# Patient Record
Sex: Female | Born: 1965 | Race: White | Hispanic: No | Smoking: Current every day smoker
Health system: Southern US, Community
[De-identification: ages and names within clinical notes are randomized; demographics above are authoritative.]

## PROBLEM LIST (undated history)

## (undated) DIAGNOSIS — G43909 Migraine, unspecified, not intractable, without status migrainosus: Secondary | ICD-10-CM

## (undated) DIAGNOSIS — G4730 Sleep apnea, unspecified: Secondary | ICD-10-CM

## (undated) DIAGNOSIS — I10 Essential (primary) hypertension: Secondary | ICD-10-CM

## (undated) DIAGNOSIS — S0300XA Dislocation of jaw, unspecified side, initial encounter: Secondary | ICD-10-CM

## (undated) DIAGNOSIS — K219 Gastro-esophageal reflux disease without esophagitis: Secondary | ICD-10-CM

---

## 2013-03-02 ENCOUNTER — Encounter (HOSPITAL_BASED_OUTPATIENT_CLINIC_OR_DEPARTMENT_OTHER): Payer: Self-pay | Admitting: Emergency Medicine

## 2013-03-02 ENCOUNTER — Emergency Department (HOSPITAL_BASED_OUTPATIENT_CLINIC_OR_DEPARTMENT_OTHER): Payer: BC Managed Care – PPO

## 2013-03-02 ENCOUNTER — Emergency Department (HOSPITAL_BASED_OUTPATIENT_CLINIC_OR_DEPARTMENT_OTHER)
Admission: EM | Admit: 2013-03-02 | Discharge: 2013-03-02 | Disposition: A | Discharge status: Home / Self Care | Payer: BC Managed Care – PPO | Attending: Emergency Medicine | Admitting: Emergency Medicine

## 2013-03-02 DIAGNOSIS — Y92410 Unspecified street and highway as the place of occurrence of the external cause: Secondary | ICD-10-CM | POA: Insufficient documentation

## 2013-03-02 DIAGNOSIS — K219 Gastro-esophageal reflux disease without esophagitis: Secondary | ICD-10-CM | POA: Insufficient documentation

## 2013-03-02 DIAGNOSIS — F17200 Nicotine dependence, unspecified, uncomplicated: Secondary | ICD-10-CM | POA: Insufficient documentation

## 2013-03-02 DIAGNOSIS — Z91040 Latex allergy status: Secondary | ICD-10-CM | POA: Insufficient documentation

## 2013-03-02 DIAGNOSIS — Y9389 Activity, other specified: Secondary | ICD-10-CM | POA: Insufficient documentation

## 2013-03-02 DIAGNOSIS — Z79899 Other long term (current) drug therapy: Secondary | ICD-10-CM | POA: Insufficient documentation

## 2013-03-02 DIAGNOSIS — G43909 Migraine, unspecified, not intractable, without status migrainosus: Secondary | ICD-10-CM | POA: Insufficient documentation

## 2013-03-02 DIAGNOSIS — IMO0002 Reserved for concepts with insufficient information to code with codable children: Secondary | ICD-10-CM | POA: Insufficient documentation

## 2013-03-02 DIAGNOSIS — S0993XA Unspecified injury of face, initial encounter: Secondary | ICD-10-CM | POA: Insufficient documentation

## 2013-03-02 DIAGNOSIS — I10 Essential (primary) hypertension: Secondary | ICD-10-CM | POA: Insufficient documentation

## 2013-03-02 DIAGNOSIS — Z8669 Personal history of other diseases of the nervous system and sense organs: Secondary | ICD-10-CM | POA: Insufficient documentation

## 2013-03-02 HISTORY — DX: Essential (primary) hypertension: I10

## 2013-03-02 HISTORY — DX: Migraine, unspecified, not intractable, without status migrainosus: G43.909

## 2013-03-02 HISTORY — DX: Dislocation of jaw, unspecified side, initial encounter: S03.00XA

## 2013-03-02 HISTORY — DX: Sleep apnea, unspecified: G47.30

## 2013-03-02 HISTORY — DX: Gastro-esophageal reflux disease without esophagitis: K21.9

## 2013-03-02 MED ORDER — OXYCODONE-ACETAMINOPHEN 5-325 MG PO TABS: 1.0000 | ORAL_TABLET | Freq: Four times a day (QID) | ORAL | Status: AC | PRN

## 2013-03-02 MED ORDER — METHOCARBAMOL 500 MG PO TABS: 500.0000 mg | ORAL_TABLET | Freq: Two times a day (BID) | ORAL | Status: AC

## 2013-03-02 NOTE — ED Provider Notes (Signed)
Medical screening examination/treatment/procedure(s) were performed by non-physician practitioner and as supervising physician I was immediately available for consultation/collaboration.  EKG Interpretation   None         Charles B. Bernette Mayers, MD 03/02/13 2311
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
## 2013-03-12 ENCOUNTER — Emergency Department (HOSPITAL_BASED_OUTPATIENT_CLINIC_OR_DEPARTMENT_OTHER): Payer: BC Managed Care – PPO

## 2013-03-12 ENCOUNTER — Emergency Department (HOSPITAL_BASED_OUTPATIENT_CLINIC_OR_DEPARTMENT_OTHER)
Admission: EM | Admit: 2013-03-12 | Discharge: 2013-03-12 | Disposition: A | Discharge status: Home / Self Care | Payer: BC Managed Care – PPO | Attending: Emergency Medicine | Admitting: Emergency Medicine

## 2013-03-12 ENCOUNTER — Encounter (HOSPITAL_BASED_OUTPATIENT_CLINIC_OR_DEPARTMENT_OTHER): Payer: Self-pay | Admitting: Emergency Medicine

## 2013-03-12 DIAGNOSIS — K219 Gastro-esophageal reflux disease without esophagitis: Secondary | ICD-10-CM | POA: Insufficient documentation

## 2013-03-12 DIAGNOSIS — J209 Acute bronchitis, unspecified: Secondary | ICD-10-CM | POA: Insufficient documentation

## 2013-03-12 DIAGNOSIS — Z87828 Personal history of other (healed) physical injury and trauma: Secondary | ICD-10-CM | POA: Insufficient documentation

## 2013-03-12 DIAGNOSIS — M545 Low back pain, unspecified: Secondary | ICD-10-CM | POA: Insufficient documentation

## 2013-03-12 DIAGNOSIS — Z91040 Latex allergy status: Secondary | ICD-10-CM | POA: Insufficient documentation

## 2013-03-12 DIAGNOSIS — Z79899 Other long term (current) drug therapy: Secondary | ICD-10-CM | POA: Insufficient documentation

## 2013-03-12 DIAGNOSIS — I10 Essential (primary) hypertension: Secondary | ICD-10-CM | POA: Insufficient documentation

## 2013-03-12 DIAGNOSIS — M542 Cervicalgia: Secondary | ICD-10-CM | POA: Insufficient documentation

## 2013-03-12 DIAGNOSIS — G43909 Migraine, unspecified, not intractable, without status migrainosus: Secondary | ICD-10-CM | POA: Insufficient documentation

## 2013-03-12 DIAGNOSIS — F17200 Nicotine dependence, unspecified, uncomplicated: Secondary | ICD-10-CM | POA: Insufficient documentation

## 2013-03-12 DIAGNOSIS — G8911 Acute pain due to trauma: Secondary | ICD-10-CM | POA: Insufficient documentation

## 2013-03-12 NOTE — ED Notes (Signed)
Patient here with increased congestion and cough for the past few days. Was being treated for bronchitis and reports that she feels worse.
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
## 2014-12-12 IMAGING — CR DG CHEST 2V
2 series · 2 of 2 positions shown · non-contrast
Comparison: 02/17/2013

CLINICAL DATA: Cough

EXAM:
CHEST  2 VIEW

[w chest pa]
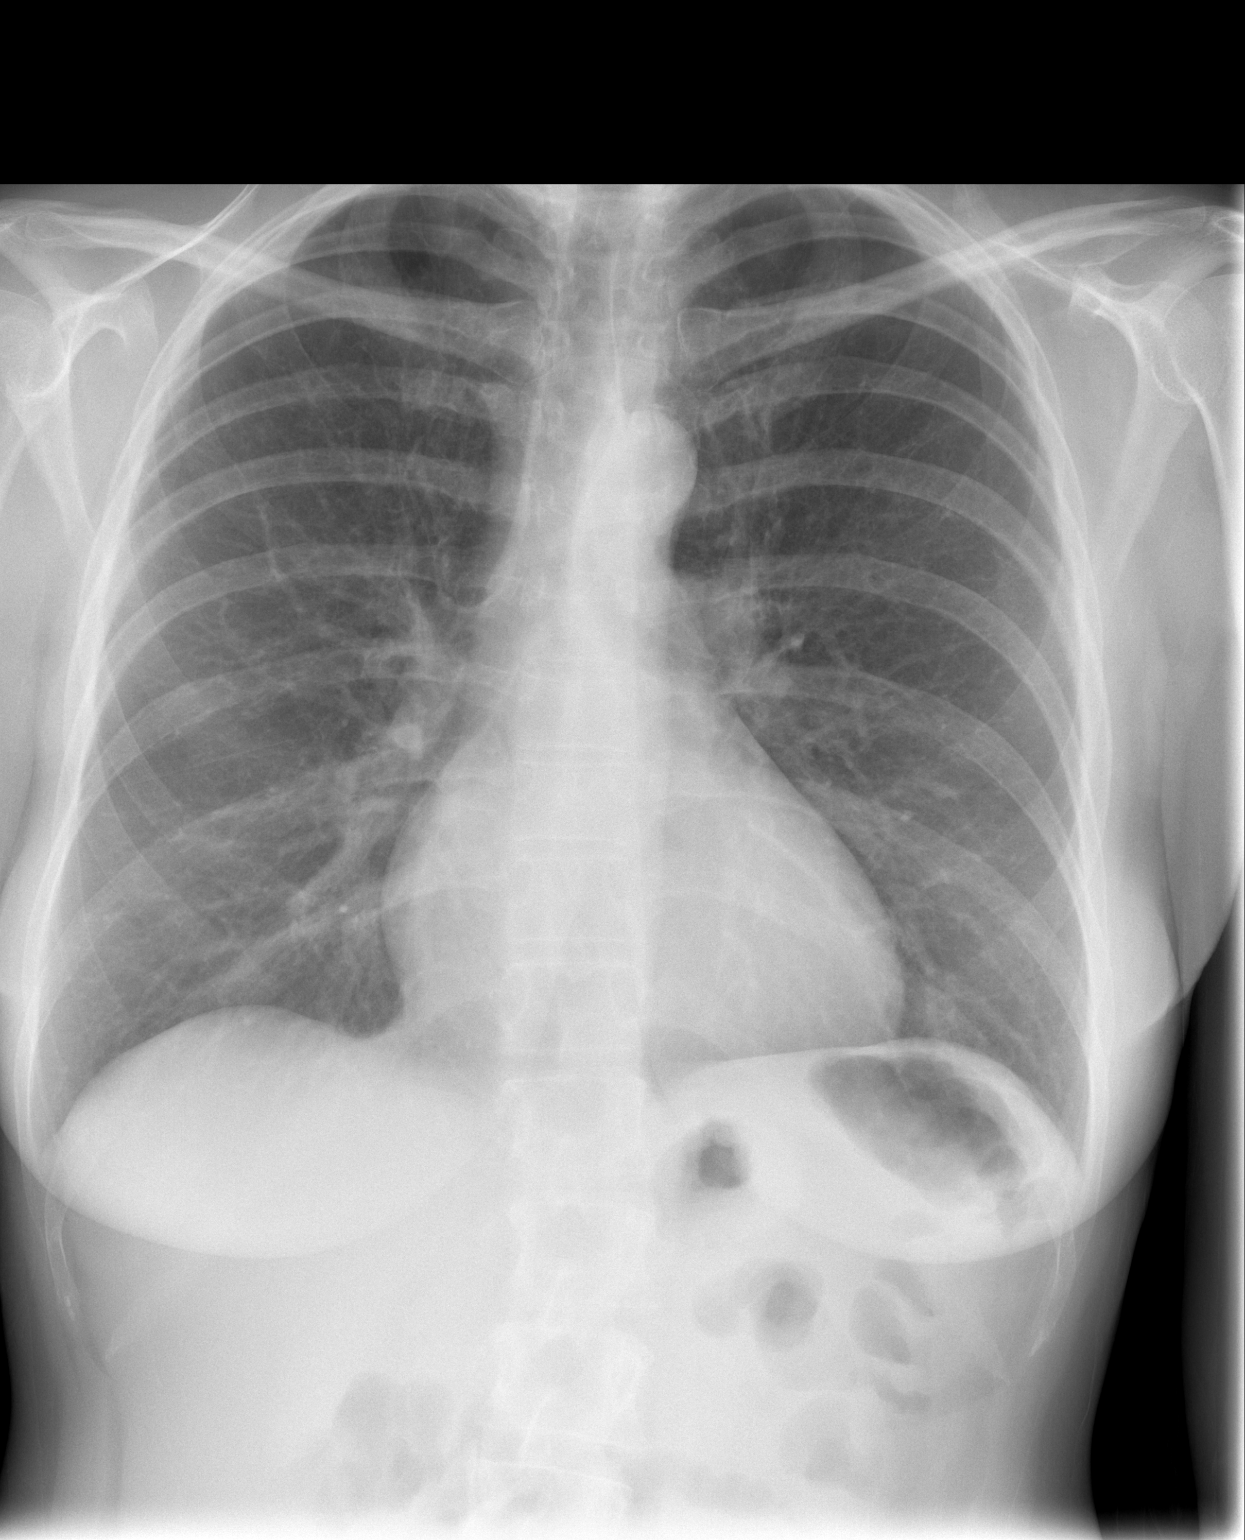

[w chest lat]
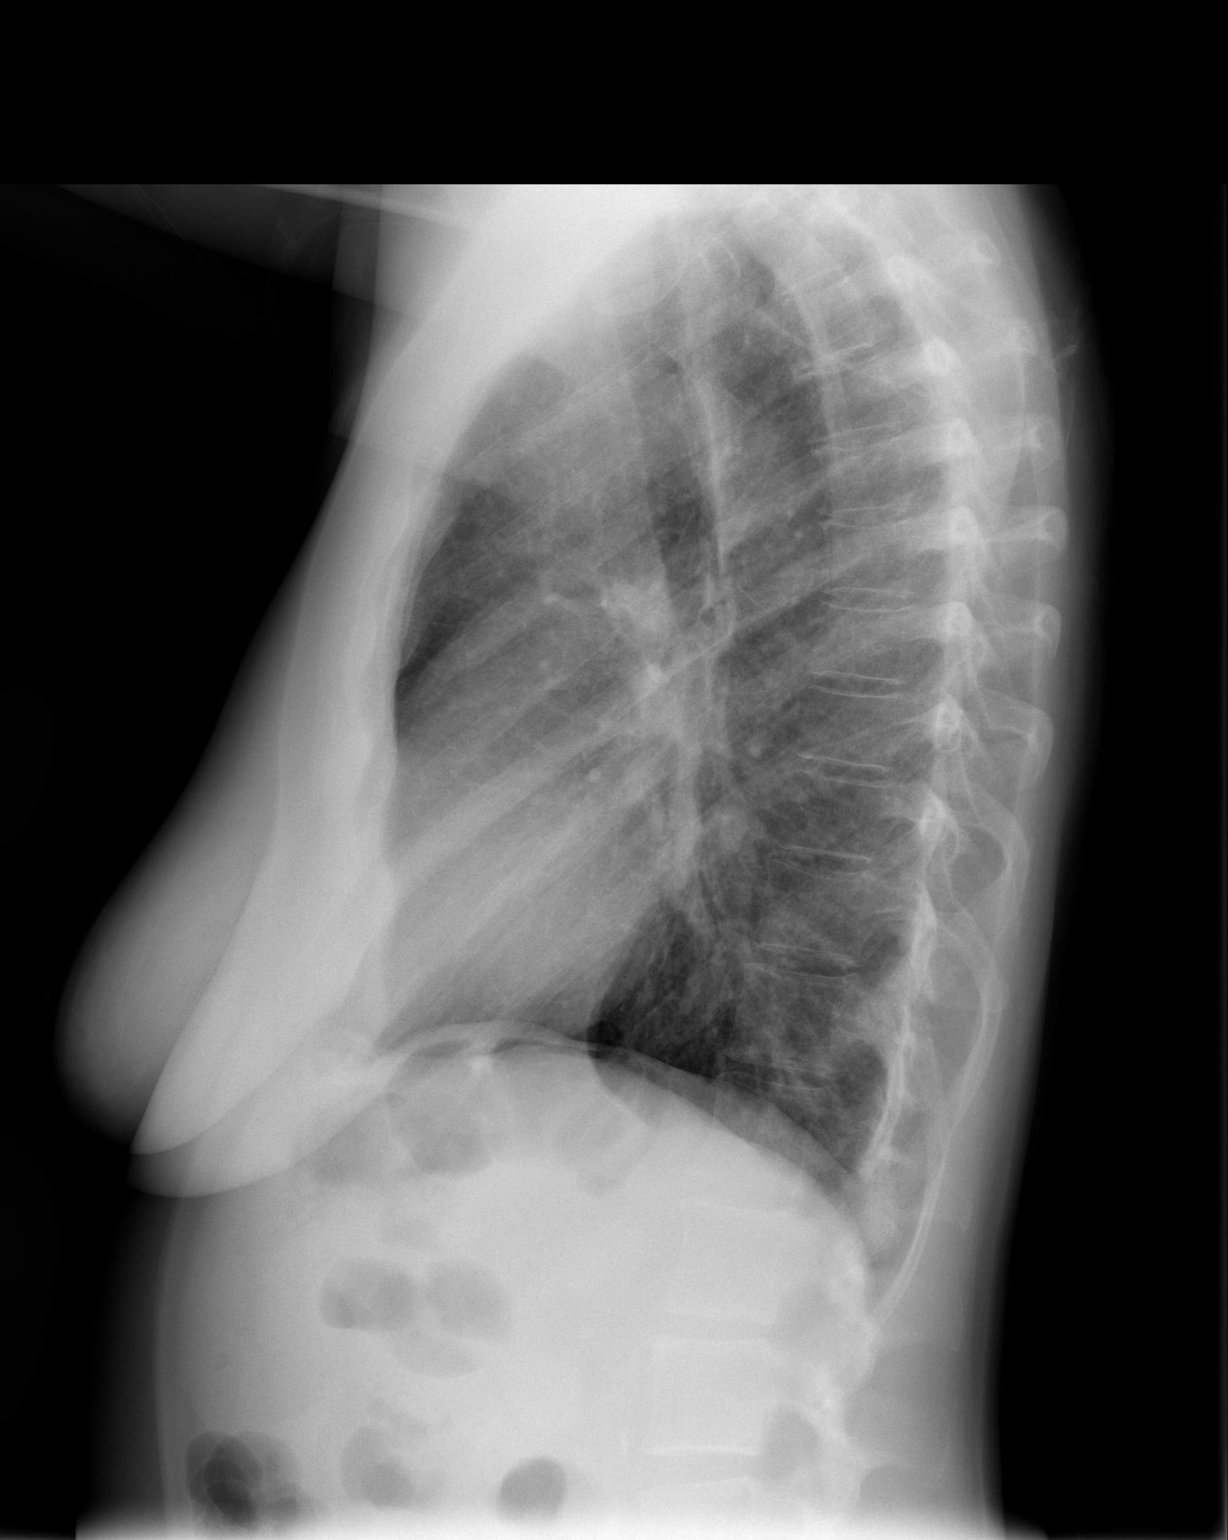

[2 of 2 positions shown; findings below may reference images not displayed]

FINDINGS: The heart size and mediastinal contours are within normal limits.
Both lungs are clear. The visualized skeletal structures are
unremarkable.
IMPRESSION: No active cardiopulmonary disease.

## 2015-02-05 ENCOUNTER — Emergency Department (HOSPITAL_BASED_OUTPATIENT_CLINIC_OR_DEPARTMENT_OTHER)
Admission: EM | Admit: 2015-02-05 | Discharge: 2015-02-05 | Disposition: A | Discharge status: Home / Self Care | Payer: BLUE CROSS/BLUE SHIELD | Attending: Emergency Medicine | Admitting: Emergency Medicine

## 2015-02-05 ENCOUNTER — Encounter (HOSPITAL_BASED_OUTPATIENT_CLINIC_OR_DEPARTMENT_OTHER): Payer: Self-pay | Admitting: *Deleted

## 2015-02-05 ENCOUNTER — Emergency Department (HOSPITAL_BASED_OUTPATIENT_CLINIC_OR_DEPARTMENT_OTHER): Payer: BLUE CROSS/BLUE SHIELD

## 2015-02-05 DIAGNOSIS — Z79899 Other long term (current) drug therapy: Secondary | ICD-10-CM | POA: Insufficient documentation

## 2015-02-05 DIAGNOSIS — K219 Gastro-esophageal reflux disease without esophagitis: Secondary | ICD-10-CM | POA: Insufficient documentation

## 2015-02-05 DIAGNOSIS — G43909 Migraine, unspecified, not intractable, without status migrainosus: Secondary | ICD-10-CM | POA: Diagnosis not present

## 2015-02-05 DIAGNOSIS — I10 Essential (primary) hypertension: Secondary | ICD-10-CM | POA: Diagnosis not present

## 2015-02-05 DIAGNOSIS — Z91040 Latex allergy status: Secondary | ICD-10-CM | POA: Insufficient documentation

## 2015-02-05 DIAGNOSIS — J209 Acute bronchitis, unspecified: Secondary | ICD-10-CM | POA: Diagnosis not present

## 2015-02-05 DIAGNOSIS — R05 Cough: Secondary | ICD-10-CM

## 2015-02-05 DIAGNOSIS — F17210 Nicotine dependence, cigarettes, uncomplicated: Secondary | ICD-10-CM | POA: Insufficient documentation

## 2015-02-05 DIAGNOSIS — R0781 Pleurodynia: Secondary | ICD-10-CM

## 2015-02-05 DIAGNOSIS — R079 Chest pain, unspecified: Secondary | ICD-10-CM | POA: Diagnosis present

## 2015-02-05 DIAGNOSIS — R059 Cough, unspecified: Secondary | ICD-10-CM

## 2015-02-05 LAB — URINALYSIS, ROUTINE W REFLEX MICROSCOPIC
Bilirubin Urine: NEGATIVE
Glucose, UA: NEGATIVE mg/dL
Hgb urine dipstick: NEGATIVE
Ketones, ur: NEGATIVE mg/dL
Leukocytes, UA: NEGATIVE
Nitrite: NEGATIVE
Protein, ur: NEGATIVE mg/dL
Specific Gravity, Urine: 1.01 (ref 1.005–1.030)
pH: 7 (ref 5.0–8.0)

## 2015-02-05 LAB — BASIC METABOLIC PANEL
Anion gap: 8 (ref 5–15)
BUN: 16 mg/dL (ref 6–20)
CO2: 24 mmol/L (ref 22–32)
Calcium: 9.1 mg/dL (ref 8.9–10.3)
Chloride: 109 mmol/L (ref 101–111)
Creatinine, Ser: 0.84 mg/dL (ref 0.44–1.00)
GFR calc Af Amer: >60 mL/min (ref >60)
GFR calc non Af Amer: >60 mL/min (ref >60)
Glucose, Bld: 97 mg/dL (ref 65–99)
Potassium: 3.7 mmol/L (ref 3.5–5.1)
Sodium: 141 mmol/L (ref 135–145)

## 2015-02-05 LAB — CBC
HCT: 43.8 % (ref 36.0–46.0)
Hemoglobin: 14.4 g/dL (ref 12.0–15.0)
MCH: 30.3 pg (ref 26.0–34.0)
MCHC: 32.9 g/dL (ref 30.0–36.0)
MCV: 92 fL (ref 78.0–100.0)
Platelets: 250 10*3/uL (ref 150–400)
RBC: 4.76 MIL/uL (ref 3.87–5.11)
RDW: 13.9 % (ref 11.5–15.5)
WBC: 12.6 10*3/uL — ABNORMAL HIGH (ref 4.0–10.5)

## 2015-02-05 LAB — TROPONIN I

## 2015-02-05 MED ORDER — BENZONATATE 100 MG PO CAPS: 100.0000 mg | ORAL_CAPSULE | Freq: Three times a day (TID) | ORAL | Status: AC

## 2015-02-05 MED ORDER — IBUPROFEN 800 MG PO TABS
800.0000 mg | ORAL_TABLET | Freq: Three times a day (TID) | ORAL | Status: AC
Start: 2015-02-05 — End: ?

## 2015-02-05 MED ORDER — PREDNISONE 10 MG PO TABS: 40.0000 mg | ORAL_TABLET | Freq: Every day | ORAL | Status: AC

## 2015-02-05 NOTE — Discharge Instructions (Signed)
You have been seen today for chest pain and cough.  Your imaging showed no abnormalities. It is suspected that you have a bronchitis, most of which are viral, and do not require antibiotics. Continue to use your Symbicort as needed. Follow up with PCP as needed. Return to ED should symptoms worsen.

## 2015-02-05 NOTE — ED Provider Notes (Signed)
CSN: [id number]     Arrival date & time 02/05/15  1237 History   First MD Initiated Contact with Patient 02/05/15 1414     Chief Complaint  Patient presents with  . Chest Pain     (Consider location/radiation/quality/duration/timing/severity/associated sxs/prior Treatment) HPI   Paige Rogers is a 49 y.o. female, with a history of Asthma, 1 PPD smoker, HTN and GERD, presenting to the ED with left sided chest pain for the past two weeks. Pain is burning, intermittent, 7/10, non-radiating. Pain increases with deep breathing. Pt adds that she has also had a productive cough with clear sputum. Pt has symbicort for her asthma and this has helped her cough and her chest pain. Pt also states that her vicodin helps her pain somewhat. Pt denies fever, shortness of breath, recent illness, or any other pain or complaints.     Past Medical History  Diagnosis Date  . Hypertension   . Migraine   . GERD (gastroesophageal reflux disease)   . Sleep apnea   . TMJ (dislocation of temporomandibular joint)    History reviewed. No pertinent past surgical history. No family history on file. Social History  Substance Use Topics  . Smoking status: Current Every Day Smoker -- 1.00 packs/day    Types: Cigarettes  . Smokeless tobacco: None  . Alcohol Use: Yes     Comment: occasional   OB History    No data available     Review of Systems  Constitutional: Negative for fever, chills, diaphoresis and unexpected weight change.  Respiratory: Positive for cough. Negative for chest tightness and shortness of breath.   Cardiovascular: Positive for chest pain. Negative for palpitations and leg swelling.  Gastrointestinal: Negative for nausea, vomiting, abdominal pain, diarrhea and constipation.  Genitourinary: Negative for dysuria and flank pain.  Musculoskeletal: Negative for back pain.  Skin: Negative for color change and pallor.  Neurological: Negative for dizziness, syncope, weakness and  light-headedness.  All other systems reviewed and are negative.     Allergies  Latex  Home Medications   Prior to Admission medications   Medication Sig Start Date End Date Taking? Authorizing Provider  benzonatate (TESSALON) 100 MG capsule Take 1 capsule (100 mg total) by mouth every 8 (eight) hours. 02/05/15   Leonila Speranza C Hutch Rhett, PA-C  cyclobenzaprine (FLEXERIL) 10 MG tablet Take 10 mg by mouth 3 (three) times daily as needed for muscle spasms.    Historical Provider, MD  hydrochlorothiazide (HYDRODIURIL) 25 MG tablet Take 25 mg by mouth daily.    Historical Provider, MD  HYDROcodone-acetaminophen (NORCO/VICODIN) 5-325 MG per tablet Take 1 tablet by mouth every 6 (six) hours as needed for moderate pain.    Historical Provider, MD  ibuprofen (ADVIL,MOTRIN) 800 MG tablet Take 1 tablet (800 mg total) by mouth 3 (three) times daily. 02/05/15   Tommie Dejoseph C Emmalyne Giacomo, PA-C  methocarbamol (ROBAXIN) 500 MG tablet Take 1 tablet (500 mg total) by mouth 2 (two) times daily. 03/02/13   Felicie Mornavid Smith, NP  metoprolol tartrate (LOPRESSOR) 25 MG tablet Take 25 mg by mouth 2 (two) times daily.    Historical Provider, MD  omeprazole (PRILOSEC) 20 MG capsule Take 20 mg by mouth daily.    Historical Provider, MD  oxyCODONE-acetaminophen (PERCOCET/ROXICET) 5-325 MG per tablet Take 1 tablet by mouth every 6 (six) hours as needed for severe pain. 03/02/13   Felicie Mornavid Smith, NP  predniSONE (DELTASONE) 10 MG tablet Take 4 tablets (40 mg total) by mouth daily. 02/05/15   Anselm PancoastShawn C Avarae Zwart,  PA-C  topiramate (TOPAMAX) 100 MG tablet Take 100 mg by mouth 2 (two) times daily.    Historical Provider, MD   BP 161/104 mmHg  Pulse 70  Temp(Src) 97.8 F (36.6 C) (Oral)  Resp 18  Ht  (1.6 m)  Wt 110 lb (49.896 kg)  BMI 19.49 kg/m2 Physical Exam  Constitutional: She appears well-developed and well-nourished. No distress.  HENT:  Head: Normocephalic and atraumatic.  Eyes: Conjunctivae are normal. Pupils are equal, round, and reactive to  light.  Cardiovascular: Normal rate, regular rhythm and normal heart sounds.   Pulmonary/Chest: Effort normal and breath sounds normal. No respiratory distress.  Pain is not reproducible on palpation. Skin over the chest is free from lesions. Pt denies pain or abnormality in the breast.   Abdominal: Soft. Bowel sounds are normal.  Musculoskeletal: She exhibits no edema or tenderness.  Neurological: She is alert.  Skin: Skin is warm and dry. She is not diaphoretic.  Nursing note and vitals reviewed.   ED Course  Procedures (including critical care time) Labs Review Labs Reviewed  CBC - Abnormal; Notable for the following:    WBC 12.6 (*)    All other components within normal limits  URINALYSIS, ROUTINE W REFLEX MICROSCOPIC (NOT AT [facility]) - Abnormal; Notable for the following:    APPearance CLOUDY (*)    All other components within normal limits  BASIC METABOLIC PANEL  TROPONIN I    Imaging Review Dg Chest 2 View  02/05/2015  CLINICAL DATA:  Left chest pain and shortness of breath for 2 weeks. Cough for 4-5 days. EXAM: CHEST  2 VIEW COMPARISON:  PA and lateral chest 03/12/2013. FINDINGS: The lungs are clear. Heart size is normal. No pneumothorax or pleural effusion. No focal bony abnormality. IMPRESSION: Negative chest. Electronically Signed   By: Drusilla Kanner M.D.   On: 02/05/2015 13:23   I have personally reviewed and evaluated these images and lab results as part of my medical decision-making.   EKG Interpretation   Date/Time:  Friday February 05 2015 12:54:21 EST Ventricular Rate:  67 PR Interval:  148 QRS Duration: 84 QT Interval:  426 QTC Calculation: 450 R Axis:   54 Text Interpretation:  Normal sinus rhythm Nonspecific ST abnormality  Abnormal ECG Confirmed by Juleen China  MD, STEPHEN ([id number]) on 02/05/2015 2:20:14  PM      MDM   Final diagnoses:  Pleuritic chest pain  Cough  Acute bronchitis, unspecified organism    Paige Rogers presents with left sided  burning chest pain for the last two weeks accompanied by a cough.  Findings and plan of care discussed with Jerelyn Scott, MD.  Patient's HEART score puts her in the low risk category with only 2 points. Pt Well's criteria is also low risk with zero points. Patient's presentation is suggestive of bronchitis, most likely viral. Plan to treat patient symptomatically and, if troponin is negative, discharge patient with instructions for symptomatic care and follow-up with PCP if needed. This plan of care was communicated with the patient, who agreed with the plan and is comfortable with discharge.      Anselm Pancoast, PA-C 02/05/15 1554  Jerelyn Scott, MD 02/05/15 [phone]

## 2015-02-05 NOTE — ED Notes (Signed)
Chest pain x 2 weeks. Hx of HTN. Burning sensation with intermittent stabbing to her left chest.
